# Patient Record
Sex: Female | Born: 1965 | Race: White | Hispanic: No | Marital: Married | Smoking: Never smoker
Health system: Southern US, Community
[De-identification: ages and names within clinical notes are randomized; demographics above are authoritative.]

## PROBLEM LIST (undated history)

## (undated) DIAGNOSIS — J302 Other seasonal allergic rhinitis: Secondary | ICD-10-CM

## (undated) DIAGNOSIS — F329 Major depressive disorder, single episode, unspecified: Secondary | ICD-10-CM

## (undated) DIAGNOSIS — F32A Depression, unspecified: Secondary | ICD-10-CM

## (undated) HISTORY — PX: OTHER SURGICAL HISTORY: SHX169

---

## 2006-03-05 ENCOUNTER — Other Ambulatory Visit: Admission: RE | Admit: 2006-03-05 | Discharge: 2006-03-05 | Payer: Self-pay | Admitting: Gynecology

## 2006-09-04 ENCOUNTER — Emergency Department (HOSPITAL_COMMUNITY): Admission: EM | Admit: 2006-09-04 | Discharge: 2006-09-04 | Payer: Self-pay | Admitting: Emergency Medicine

## 2006-09-11 ENCOUNTER — Encounter: Admission: RE | Admit: 2006-09-11 | Discharge: 2006-09-11 | Payer: Self-pay | Admitting: Family Medicine

## 2007-03-18 ENCOUNTER — Other Ambulatory Visit: Admission: RE | Admit: 2007-03-18 | Discharge: 2007-03-18 | Payer: Self-pay | Admitting: Gynecology

## 2007-03-23 ENCOUNTER — Ambulatory Visit: Payer: Self-pay | Admitting: Oncology

## 2008-03-28 ENCOUNTER — Ambulatory Visit: Payer: Self-pay | Admitting: Genetic Counselor

## 2010-02-02 ENCOUNTER — Encounter: Admission: RE | Admit: 2010-02-02 | Discharge: 2010-02-02 | Payer: Self-pay | Admitting: Gynecology

## 2011-02-26 ENCOUNTER — Other Ambulatory Visit: Payer: Self-pay | Admitting: Gynecology

## 2011-02-26 DIAGNOSIS — Z1231 Encounter for screening mammogram for malignant neoplasm of breast: Secondary | ICD-10-CM

## 2011-04-16 ENCOUNTER — Ambulatory Visit: Payer: Self-pay

## 2011-04-30 ENCOUNTER — Ambulatory Visit: Payer: Self-pay

## 2011-05-14 ENCOUNTER — Ambulatory Visit: Payer: Self-pay

## 2012-02-25 ENCOUNTER — Other Ambulatory Visit: Payer: Self-pay | Admitting: Gynecology

## 2012-02-25 DIAGNOSIS — R922 Inconclusive mammogram: Secondary | ICD-10-CM

## 2012-03-04 ENCOUNTER — Ambulatory Visit
Admission: RE | Admit: 2012-03-04 | Discharge: 2012-03-04 | Disposition: A | Discharge status: Home / Self Care | Payer: Private Health Insurance - Indemnity | Source: Ambulatory Visit | Attending: Gynecology | Admitting: Gynecology

## 2012-03-04 ENCOUNTER — Other Ambulatory Visit: Payer: Self-pay | Admitting: Gynecology

## 2012-03-04 DIAGNOSIS — R9230 Dense breasts, unspecified: Secondary | ICD-10-CM

## 2012-03-04 DIAGNOSIS — R922 Inconclusive mammogram: Secondary | ICD-10-CM

## 2014-05-03 ENCOUNTER — Other Ambulatory Visit: Payer: Self-pay | Admitting: Obstetrics and Gynecology

## 2014-05-09 ENCOUNTER — Other Ambulatory Visit: Payer: Self-pay | Admitting: Obstetrics and Gynecology

## 2014-05-09 DIAGNOSIS — N6311 Unspecified lump in the right breast, upper outer quadrant: Secondary | ICD-10-CM

## 2014-06-08 ENCOUNTER — Other Ambulatory Visit: Payer: Private Health Insurance - Indemnity

## 2014-06-20 ENCOUNTER — Ambulatory Visit
Admission: RE | Admit: 2014-06-20 | Discharge: 2014-06-20 | Disposition: A | Discharge status: Home / Self Care | Payer: Managed Care, Other (non HMO) | Source: Ambulatory Visit | Attending: Obstetrics and Gynecology | Admitting: Obstetrics and Gynecology

## 2014-06-20 DIAGNOSIS — N6311 Unspecified lump in the right breast, upper outer quadrant: Secondary | ICD-10-CM

## 2016-06-11 ENCOUNTER — Telehealth: Payer: Self-pay | Admitting: Urology

## 2016-06-11 DIAGNOSIS — Z79899 Other long term (current) drug therapy: Secondary | ICD-10-CM | POA: Diagnosis not present

## 2016-06-11 DIAGNOSIS — R1032 Left lower quadrant pain: Secondary | ICD-10-CM | POA: Diagnosis not present

## 2016-06-11 DIAGNOSIS — N132 Hydronephrosis with renal and ureteral calculous obstruction: Secondary | ICD-10-CM | POA: Diagnosis not present

## 2016-06-11 DIAGNOSIS — R1011 Right upper quadrant pain: Secondary | ICD-10-CM | POA: Diagnosis not present

## 2016-06-11 DIAGNOSIS — F17200 Nicotine dependence, unspecified, uncomplicated: Secondary | ICD-10-CM | POA: Diagnosis not present

## 2016-06-11 DIAGNOSIS — R109 Unspecified abdominal pain: Secondary | ICD-10-CM | POA: Diagnosis not present

## 2016-06-11 NOTE — Telephone Encounter (Signed)
Patient currently at [facility] in the ED with a 4.128mm left mid-ureteral stone.  ED physician having difficult time with the pain control.  Patient anxious about leaving ED due to pain.  Hospitalist at [facility] reluctant to admit patient since there is no Urologist there.  Request [facility].  I agreed to consult on this patient in the AM if she is transferred to [facility] for pain control.  IF in the AM she is more comfortable, we would likely arrange shockwave lithotripsy on Thursday as outpatient.  If her pain is uncontrolled we would potentially proceed to OR for ureteroscopy.  If patient is admitted, would recommend IV tylenol.  A second option would be IV lidocaine 1.5mg /kg infused over 60 minutes.  Would not recommend Toradol since this may prevent her from having her stone treated on Thursday.

## 2016-06-12 ENCOUNTER — Encounter (HOSPITAL_COMMUNITY): Payer: Self-pay

## 2016-06-12 ENCOUNTER — Observation Stay (HOSPITAL_COMMUNITY)
Admission: AD | Admit: 2016-06-12 | Discharge: 2016-06-13 | Disposition: A | Discharge status: Home / Self Care | Payer: BLUE CROSS/BLUE SHIELD | Source: Other Acute Inpatient Hospital | Attending: Internal Medicine | Admitting: Internal Medicine

## 2016-06-12 DIAGNOSIS — R3129 Other microscopic hematuria: Secondary | ICD-10-CM

## 2016-06-12 DIAGNOSIS — Z79899 Other long term (current) drug therapy: Secondary | ICD-10-CM | POA: Diagnosis not present

## 2016-06-12 DIAGNOSIS — N200 Calculus of kidney: Secondary | ICD-10-CM | POA: Diagnosis not present

## 2016-06-12 DIAGNOSIS — J302 Other seasonal allergic rhinitis: Secondary | ICD-10-CM | POA: Insufficient documentation

## 2016-06-12 DIAGNOSIS — F418 Other specified anxiety disorders: Secondary | ICD-10-CM | POA: Diagnosis not present

## 2016-06-12 DIAGNOSIS — Z79890 Hormone replacement therapy (postmenopausal): Secondary | ICD-10-CM | POA: Insufficient documentation

## 2016-06-12 DIAGNOSIS — R112 Nausea with vomiting, unspecified: Secondary | ICD-10-CM | POA: Diagnosis not present

## 2016-06-12 DIAGNOSIS — N136 Pyonephrosis: Principal | ICD-10-CM | POA: Insufficient documentation

## 2016-06-12 DIAGNOSIS — N390 Urinary tract infection, site not specified: Secondary | ICD-10-CM

## 2016-06-12 DIAGNOSIS — N1330 Unspecified hydronephrosis: Secondary | ICD-10-CM

## 2016-06-12 DIAGNOSIS — R319 Hematuria, unspecified: Secondary | ICD-10-CM

## 2016-06-12 DIAGNOSIS — N201 Calculus of ureter: Secondary | ICD-10-CM | POA: Diagnosis not present

## 2016-06-12 DIAGNOSIS — R1032 Left lower quadrant pain: Secondary | ICD-10-CM | POA: Diagnosis not present

## 2016-06-12 HISTORY — DX: Major depressive disorder, single episode, unspecified: F32.9

## 2016-06-12 HISTORY — DX: Depression, unspecified: F32.A

## 2016-06-12 HISTORY — DX: Other seasonal allergic rhinitis: J30.2

## 2016-06-12 MED ORDER — SODIUM CHLORIDE 0.9 % IV SOLN
INTRAVENOUS | Status: DC
  Administered 2016-06-12: 19:00:00 via INTRAVENOUS

## 2016-06-12 MED ORDER — LORATADINE 10 MG PO TABS
10.0000 mg | ORAL_TABLET | Freq: Every day | ORAL | Status: DC
  Administered 2016-06-12 – 2016-06-13 (×2): 10 mg via ORAL
  Filled 2016-06-12 (×2): qty 1

## 2016-06-12 MED ORDER — TAMSULOSIN HCL 0.4 MG PO CAPS: 0.4000 mg | ORAL_CAPSULE | Freq: Every day | ORAL | Status: DC

## 2016-06-12 MED ORDER — PROGESTERONE MICRONIZED 100 MG PO CAPS
100.0000 mg | ORAL_CAPSULE | Freq: Every day | ORAL | Status: DC
  Administered 2016-06-12: 100 mg via ORAL
  Filled 2016-06-12: qty 1

## 2016-06-12 MED ORDER — ONDANSETRON HCL 4 MG PO TABS: 4.0000 mg | ORAL_TABLET | Freq: Four times a day (QID) | ORAL | Status: DC | PRN

## 2016-06-12 MED ORDER — OXYCODONE HCL 5 MG PO TABS: 5.0000 mg | ORAL_TABLET | ORAL | Status: DC | PRN

## 2016-06-12 MED ORDER — ACETAMINOPHEN 325 MG PO TABS: 650.0000 mg | ORAL_TABLET | Freq: Four times a day (QID) | ORAL | Status: DC | PRN

## 2016-06-12 MED ORDER — BUPROPION HCL ER (XL) 150 MG PO TB24
150.0000 mg | ORAL_TABLET | Freq: Every day | ORAL | Status: DC
  Administered 2016-06-12 – 2016-06-13 (×2): 150 mg via ORAL
  Filled 2016-06-12 (×2): qty 1

## 2016-06-12 MED ORDER — ACETAMINOPHEN 650 MG RE SUPP: 650.0000 mg | Freq: Four times a day (QID) | RECTAL | Status: DC | PRN

## 2016-06-12 MED ORDER — TAMSULOSIN HCL 0.4 MG PO CAPS
0.4000 mg | ORAL_CAPSULE | Freq: Every day | ORAL | Status: DC
  Administered 2016-06-13: 0.4 mg via ORAL
  Filled 2016-06-12: qty 1

## 2016-06-12 MED ORDER — MORPHINE SULFATE (PF) 4 MG/ML IV SOLN
2.0000 mg | INTRAVENOUS | Status: DC | PRN
  Administered 2016-06-12: 2 mg via INTRAVENOUS
  Filled 2016-06-12: qty 1

## 2016-06-12 MED ORDER — KETOROLAC TROMETHAMINE 30 MG/ML IJ SOLN
30.0000 mg | Freq: Four times a day (QID) | INTRAMUSCULAR | Status: DC | PRN
  Administered 2016-06-12 – 2016-06-13 (×4): 30 mg via INTRAVENOUS
  Filled 2016-06-12 (×4): qty 1

## 2016-06-12 MED ORDER — ONDANSETRON HCL 4 MG/2ML IJ SOLN
4.0000 mg | Freq: Four times a day (QID) | INTRAMUSCULAR | Status: DC | PRN
  Administered 2016-06-12: 4 mg via INTRAVENOUS
  Filled 2016-06-12: qty 2

## 2016-06-12 MED ORDER — ESTRADIOL 0.05 MG/24HR TD PTWK: 0.0500 mg | MEDICATED_PATCH | Freq: Once | TRANSDERMAL | Status: DC

## 2016-06-12 NOTE — Progress Notes (Signed)
This is a no charge note  Transfer from [facility] per Dr. Franchot Erichsenallara.  51 year old lady with past medical history of depression, who presents with left flank pain, found to have a 57 cm left kidney stone. No fever or chills. Urinalysis negative for UTI, but positive for blood. Urology, Dr. Marlou PorchHerrick was consulted, who agreed to consult on this patient in the AM. Per Dr. Marlou PorchHerrick, if in the AM she is more comfortable, would likely arrange shockwave lithotripsy on Thursday as outpatient, but if her pain is uncontrolled, would potentially proceed to OR for ureteroscopy. Per Dr. Marlou PorchHerrick, "If patient is admitted, would recommend IV tylenol.  A second option would be IV lidocaine 1.5mg /kg infused over 60 minutes.  Would not recommend Toradol since this may prevent her from having her stone treated on Thursday". Pt is accepted to med-surg bed as inpt.  Lorretta HarpXilin Adisa Litt, MD  [facility] Pager [phone]  If 7PM-7AM, please contact night-coverage [email or web address] Password [email or web address] 06/12/2016, 4:45 AM

## 2016-06-12 NOTE — Consult Note (Signed)
Urology Consult   Physician requesting consult: Rizwan Reason for consult: Kidney stone  History of Present Illness: [name] is a 51 y.o. female who presented to the emergency room at [facility] in [place] last night following sudden onset of left flank pain, nausea and vomiting at 7 p.m.  She was found to have a 5 by 7 millimeter left ureteral stone.  Apparently, she could not be made comfortable there, and the hospitalist service would not admit her for comfort measures.  There is no urologist in [place] at this time.  Urology here, was contacted and instructed the emergency room staff to transport the patient to [facility] for further evaluation and management of the stone.  The patient, until just prior to her transport, was fairly comfortable.  She did get a small dose of fentanyl prior to the transportation.  Currently, she is fairly comfortable.  She has had no fever, gross hematuria, or long-standing left flank pain.  This is her first kidney stone.    Past Medical History:  Diagnosis Date  . Depression   . Seasonal allergies     Past Surgical History:  Procedure Laterality Date  . CESAREAN SECTION    . Left hand surgery       Current Hospital Medications: Scheduled Meds: . buPROPion  150 mg Oral Daily  . [START ON 06/14/2016] estradiol  0.05 mg Transdermal Once  . loratadine  10 mg Oral Daily  . progesterone  100 mg Oral QHS  . tamsulosin  0.4 mg Oral Daily   Continuous Infusions: . sodium chloride     PRN Meds:.acetaminophen **OR** acetaminophen, morphine injection, ondansetron **OR** ondansetron (ZOFRAN) IV, oxyCODONE  Allergies:  Allergies  Allergen Reactions  . No Known Allergies     History reviewed. No pertinent family history.  Social History:  reports that she has never smoked. She has never used smokeless tobacco. She reports that she does not drink alcohol or use drugs.  ROS: A complete review of systems was  performed.  All systems are negative except for pertinent findings as noted.  Physical Exam:  Vital signs in last 24 hours: Temp:  [98.4 F (36.9 C)] 98.4 F (36.9 C) (03/07 1607) Pulse Rate:  [68] 68 (03/07 1607) Resp:  [20] 20 (03/07 1607) BP: (142)/(62) 142/62 (03/07 1607) SpO2:  [99 %] 99 % (03/07 1607) Weight:  [65.8 kg (145 lb)] 65.8 kg (145 lb) (03/07 1607) General:  Alert and oriented, No acute distress HEENT: Normocephalic, atraumatic Neck: No JVD or lymphadenopathy Cardiovascular: Regular rate and rhythm Lungs: Clear bilaterally Abdomen: Soft, nontender, nondistended, no abdominal masses Extremities: No edema Neurologic: Grossly intact  Laboratory Data:  No results for input(s): WBC, HGB, HCT, PLT in the last 72 hours.  No results for input(s): NA, K, CL, GLUCOSE, BUN, CALCIUM, CREATININE in the last 72 hours.  Invalid input(s): CO3   No results found for this or any previous visit (from the past 24 hour(s)). No results found for this or any previous visit (from the past 240 hour(s)).  Renal Function: No results for input(s): CREATININE in the last 168 hours. CrCl cannot be calculated (No order found.).  Radiologic Imaging: I reviewed CT images with the patient.  She does have a 5 by 7 millimeter left upper ureteral stone just at the level of the lower renal margin.  No other renal calculi are noted.  I independently reviewed the above imaging studies.  Impression/Assessment:  4.3 by 8 millimeter left ureteral  stone.  This is well seen on the scout film.  Skin the stone distance approximately 12 centimeters, Hounsfield units 658.  Plan:  I agree with admission for pain management.  I discussed further management with the patient-medical expulsive therapy with pain management as an outpatient, shockwave lithotripsy or anesthetic ureteroscopy with holmium laser lithotripsy and extraction of her stone.  The patient has not been strictly nothing by mouth, so at  this point, I don't think urgent anesthetic procedure is necessary.  Unfortunately, she has had Toradol in the emergency room in [place]-even though it was only 10 milligrams intravenous, this would preclude shockwave lithotripsy due to platelet dysfunction.  I have spoken with the patient.  Currently, she is fairly comfortable.  She is in agreement that if she is comfortable overnight, morning discharge would be a possibility with going home on by mouth hydromorphone, nausea medicine, as well as Flomax.  We will see about getting her scheduled for lithotripsy for Monday the 12th.  45 minutes spent face-to-face with the patient.

## 2016-06-12 NOTE — H&P (Addendum)
History and Physical    Abigail Mills ZOX:[id number] DOB: [birth year] DOA: 06/12/2016    PCP: No primary care provider on file.  Patient coming from: home  Chief Complaint: left lower abdominal pain  HPI: Abigail Mills is a 51 y.o. female with medical history significant of depression who presents for sudden onset left lower abdominal pain on 3/6 at 7 PM associated with large amount of vomiting. [facility] and was found to have hematuria. CT showed a left nephrolithiasis and hydroureter. She was accepted [facility] be [facility]. She states pain improved after medication last night but recurred today. She was given Fentanyl today. Pain is currently a 4-5/10. She received Fentanyl per RN report before being transferred. Mild nausea. No radiation of pain. No flank pain. Movement ie, walking aggravates it. It then is more of a constant pain rather than spasmodic. No fever or chills. No h/o nephrolithiasis.    ED Course: as above  Review of Systems:  All other systems reviewed and apart from HPI, are negative.  Past Medical History:  Diagnosis Date  . Depression   . Seasonal allergies     Past Surgical History:  Procedure Laterality Date  . CESAREAN SECTION    . Left hand surgery      Social History:   reports that she has never smoked. She has never used smokeless tobacco. She reports that she does not  use drugs. She drinks alcohol occasionally.   Allergies  Allergen Reactions  . No Known Allergies     History reviewed. No pertinent family history.   Prior to Admission medications   Medication Sig Start Date End Date Taking? Authorizing Provider  buPROPion (WELLBUTRIN XL) 150 MG 24 hr tablet Take 150 mg by mouth daily. 03/25/16  Yes Historical Provider, MD  estradiol (VIVELLE-DOT) 0.05 MG/24HR patch APP 1 PA EXT TO THE SKIN 2 TIMES A WK 05/22/16  Yes Historical Provider, MD  fexofenadine (ALLEGRA) 30 MG tablet Take 30 mg by  mouth daily.    Yes Historical Provider, MD  Probiotic Product (PROBIOTIC PO) Take 1 capsule by mouth daily.   Yes Historical Provider, MD  progesterone (PROMETRIUM) 100 MG capsule TAKE 1 C BY MOUTH AT BEDTIME 05/26/16  Yes Historical Provider, MD    Physical Exam: Vitals:   06/12/16 1607  BP: (!) 142/62  Pulse: 68  Resp: 20  Temp: 98.4 F (36.9 C)  TempSrc: Oral  SpO2: 99%  Weight: 65.8 kg (145 lb)  Height: 5\' 4"  (1.626 m)      Constitutional: NAD, calm, comfortable Eyes: PERTLA, lids and conjunctivae normal ENMT: Mucous membranes are moist. Posterior pharynx clear of any exudate or lesions. Normal dentition.  Neck: normal, supple, no masses, no thyromegaly Respiratory: clear to auscultation bilaterally, no wheezing, no crackles. Normal respiratory effort. No accessory muscle use.  Cardiovascular: S1 & S2 heard, regular rate and rhythm, no murmurs / rubs / gallops. No extremity edema. 2+ pedal pulses. No carotid bruits.  Abdomen: No distension, tenderness in LLQ and suprapubic area, no masses palpated. No hepatosplenomegaly. Bowel sounds normal.  Musculoskeletal: no clubbing / cyanosis. No joint deformity upper and lower extremities. Good ROM, no contractures. Normal muscle tone.  Skin: no rashes, lesions, ulcers. No induration Neurologic: CN 2-12 grossly intact. Sensation intact, DTR normal. Strength 5/5 in all 4 limbs.  Psychiatric: Normal judgment and insight. Alert and oriented x 3. Normal mood.     Labs on Admission: I have personally  reviewed following labs and imaging studies  CBC: No results for input(s): WBC, NEUTROABS, HGB, HCT, MCV, PLT in the last 168 hours. Basic Metabolic Panel: No results for input(s): NA, K, CL, CO2, GLUCOSE, BUN, CREATININE, CALCIUM, MG, PHOS in the last 168 hours. GFR: CrCl cannot be calculated (No order found.). Liver Function Tests: No results for input(s): AST, ALT, ALKPHOS, BILITOT, PROT, ALBUMIN in the last 168 hours. No results for  input(s): LIPASE, AMYLASE in the last 168 hours. No results for input(s): AMMONIA in the last 168 hours. Coagulation Profile: No results for input(s): INR, PROTIME in the last 168 hours. Cardiac Enzymes: No results for input(s): CKTOTAL, CKMB, CKMBINDEX, TROPONINI in the last 168 hours. BNP (last 3 results) No results for input(s): PROBNP in the last 8760 hours. HbA1C: No results for input(s): HGBA1C in the last 72 hours. CBG: No results for input(s): GLUCAP in the last 168 hours. Lipid Profile: No results for input(s): CHOL, HDL, LDLCALC, TRIG, CHOLHDL, LDLDIRECT in the last 72 hours. Thyroid Function Tests: No results for input(s): TSH, T4TOTAL, FREET4, T3FREE, THYROIDAB in the last 72 hours. Anemia Panel: No results for input(s): VITAMINB12, FOLATE, FERRITIN, TIBC, IRON, RETICCTPCT in the last 72 hours. Urine analysis: No results found for: COLORURINE, APPEARANCEUR, LABSPEC, PHURINE, GLUCOSEU, HGBUR, BILIRUBINUR, KETONESUR, PROTEINUR, UROBILINOGEN, NITRITE, LEUKOCYTESUR Sepsis Labs: @LABRCNTIP (procalcitonin:4,lacticidven:4) )No results found for this or any previous visit (from the past 240 hour(s)).   Radiological Exams on Admission: No results found.     Assessment/Plan Principal Problem:   Left nephrolithiasis/ hydronephrosis/ hematuria/ abdominal pain - Flomax given last night - control pain with IV and oral pain medications - no fever or UTI - Zofran PRN for nausea- Ice chips for now - d/w Dr Retta Diones who will see the patient soon  Active Problems:     Depression with anxiety - resume Wellbutrin  Allergies - resume Allegra   Resume hormone replacement meds- estradiol and Progestrone      DVT prophylaxis: if no procedure today or tomorrow, will order Lovenox  Code Status: Full code  Family Communication:   Disposition Plan: med surg  Consults called: Urology  Admission status: inpatient     [facility] MD [facility] Pager: [email or web address] Password [email or web address] 7PM-7AM, please contact night-coverage   06/12/2016, 5:06 PM

## 2016-06-13 ENCOUNTER — Inpatient Hospital Stay: Payer: Managed Care, Other (non HMO)

## 2016-06-13 ENCOUNTER — Other Ambulatory Visit: Payer: Self-pay | Admitting: Urology

## 2016-06-13 DIAGNOSIS — J302 Other seasonal allergic rhinitis: Secondary | ICD-10-CM | POA: Diagnosis not present

## 2016-06-13 DIAGNOSIS — Z79899 Other long term (current) drug therapy: Secondary | ICD-10-CM | POA: Diagnosis not present

## 2016-06-13 DIAGNOSIS — N200 Calculus of kidney: Secondary | ICD-10-CM | POA: Diagnosis not present

## 2016-06-13 DIAGNOSIS — F418 Other specified anxiety disorders: Secondary | ICD-10-CM | POA: Diagnosis not present

## 2016-06-13 DIAGNOSIS — Z79890 Hormone replacement therapy (postmenopausal): Secondary | ICD-10-CM | POA: Diagnosis not present

## 2016-06-13 DIAGNOSIS — N136 Pyonephrosis: Secondary | ICD-10-CM | POA: Diagnosis not present

## 2016-06-13 LAB — BASIC METABOLIC PANEL
ANION GAP: 5 (ref 5–15)
BUN: 16 mg/dL (ref 6–20)
CALCIUM: 8.1 mg/dL — AB (ref 8.9–10.3)
CO2: 27 mmol/L (ref 22–32)
Chloride: 108 mmol/L (ref 101–111)
Creatinine, Ser: 0.88 mg/dL (ref 0.44–1.00)
GFR calc non Af Amer: >60 mL/min (ref >60)
Glucose, Bld: 95 mg/dL (ref 65–99)
Potassium: 3.7 mmol/L (ref 3.5–5.1)
Sodium: 140 mmol/L (ref 135–145)

## 2016-06-13 LAB — CBC
HCT: 36.4 % (ref 36.0–46.0)
HEMOGLOBIN: 12.1 g/dL (ref 12.0–15.0)
MCH: 30.1 pg (ref 26.0–34.0)
MCHC: 33.2 g/dL (ref 30.0–36.0)
MCV: 90.5 fL (ref 78.0–100.0)
Platelets: 271 10*3/uL (ref 150–400)
RBC: 4.02 MIL/uL (ref 3.87–5.11)
RDW: 12.4 % (ref 11.5–15.5)
WBC: 7.6 10*3/uL (ref 4.0–10.5)

## 2016-06-13 LAB — HIV ANTIBODY (ROUTINE TESTING W REFLEX): HIV Screen 4th Generation wRfx: NONREACTIVE

## 2016-06-13 MED ORDER — HYDROMORPHONE HCL 2 MG PO TABS: 2.0000 mg | ORAL_TABLET | ORAL | 0 refills | Status: DC | PRN

## 2016-06-13 MED ORDER — TAMSULOSIN HCL 0.4 MG PO CAPS: 0.4000 mg | ORAL_CAPSULE | Freq: Every day | ORAL | 0 refills | Status: AC

## 2016-06-13 MED ORDER — TAMSULOSIN HCL 0.4 MG PO CAPS: 0.4000 mg | ORAL_CAPSULE | Freq: Every day | ORAL | 0 refills | Status: DC

## 2016-06-13 NOTE — Discharge Summary (Signed)
Discharge Summary  Abigail Mills [id number] DOB: 1965-04-10  PCP: No primary care provider on file.  Admit date: 06/12/2016 Discharge date: 06/13/2016  Time spent: <8730mins  Recommendations for Outpatient Follow-up:  1. F/u with PMD within a week  for hospital discharge follow up, repeat cbc/bmp at follow up 2. F/u with urology on 3/12 for lithotripsy  Discharge Diagnoses:  Active Hospital Problems   Diagnosis Date Noted  . Left nephrolithiasis 06/12/2016  . Hydronephrosis of left kidney 06/12/2016  . Depression with anxiety 06/12/2016  . Hematuria 06/12/2016  . Nephrolithiasis 06/12/2016    Resolved Hospital Problems   Diagnosis Date Noted Date Resolved  . Urinary tract infection 06/12/2016 06/12/2016    Discharge Condition: stable  Diet recommendation: regular diet  Filed Weights   06/12/16 1607  Weight: 65.8 kg (145 lb)    History of present illness:  Patient coming from: home  Chief Complaint: left lower abdominal pain  HPI: Abigail Mills is a 51 y.o. female with medical history significant of depression who presents for sudden onset left lower abdominal pain on 3/6 at 7 PM associated with large amount of vomiting. [facility] and was found to have hematuria. CT showed a left nephrolithiasis and hydroureter. She was [facility]. She states pain improved after medication last night but recurred today. She was given Fentanyl today. Pain is currently a 4-5/10. She received Fentanyl per RN report before being transferred. Mild nausea. No radiation of pain. No flank pain. Movement ie, walking aggravates it. It then is more of a constant pain rather than spasmodic. No fever or chills. No h/o nephrolithiasis.    ED Course: as above  Hospital Course:  Principal Problem:   Left nephrolithiasis Active Problems:   Hydronephrosis of left kidney   Depression with anxiety   Hematuria  Nephrolithiasis   Left nephrolithiasis/ hydronephrosis/ hematuria/ abdominal pain - Flomax given last night - control pain with IV and oral pain medications - no fever or UTI - Zofran PRN for nausea-  - urology Dr Retta Dionesahlstedt input appreciated, she is cleared to discharge home with dilaudid prn and flomax and outpatient lithotripsy on monday  Active Problems:     Depression with anxiety - resume Wellbutrin  Allergies - resume Allegra   Resume hormone replacement meds- estradiol and Progestrone      Code Status: Full code  Family Communication:  patient Disposition Plan: med surg  Consults called: Urology    Discharge Exam: BP 102/64 (BP Location: Left Arm)   Pulse 77   Temp 97.8 F (36.6 C) (Oral)   Resp 20   Ht 5\' 4"  (1.626 m)   Wt 65.8 kg (145 lb)   SpO2 95%   BMI 24.89 kg/m   General: NAD Cardiovascular: RRR Respiratory: CTABL Abdominal, mild left flank tenderness  Discharge Instructions You were cared for by a hospitalist during your hospital stay. If you have any questions about your discharge medications or the care you received while you were in the hospital after you are discharged, you can call the unit and asked to speak with the hospitalist on call if the hospitalist that took care of you is not available. Once you are discharged, your primary care physician will handle any further medical issues. Please note that NO REFILLS for any discharge medications will be authorized once you are discharged, as it is imperative that you return to your primary care physician (or establish a relationship with a  primary care physician if you do not have one) for your aftercare needs so that they can reassess your need for medications and monitor your lab values.  Discharge Instructions    Diet general    Complete by:  As directed    Increase activity slowly    Complete by:  As directed      Allergies as of 06/13/2016      Reactions   No Known Allergies         Medication List    TAKE these medications   buPROPion 150 MG 24 hr tablet Commonly known as:  WELLBUTRIN XL Take 150 mg by mouth daily.   estradiol 0.05 MG/24HR patch Commonly known as:  VIVELLE-DOT APP 1 PA EXT TO THE SKIN 2 TIMES A WK   fexofenadine 30 MG tablet Commonly known as:  ALLEGRA Take 30 mg by mouth daily.   HYDROmorphone 2 MG tablet Commonly known as:  DILAUDID Take 1 tablet (2 mg total) by mouth every 4 (four) hours as needed for severe pain.   PROBIOTIC PO Take 1 capsule by mouth daily.   progesterone 100 MG capsule Commonly known as:  PROMETRIUM TAKE 1 C BY MOUTH AT BEDTIME   tamsulosin 0.4 MG Caps capsule Commonly known as:  FLOMAX Take 1 capsule (0.4 mg total) by mouth daily after supper.      Allergies  Allergen Reactions  . No Known Allergies    Follow-up Information    [facility] Follow up.   Contact information: [address]  [place] [address] [place] [address] [phone]            The results of significant diagnostics from this hospitalization (including imaging, microbiology, ancillary and laboratory) are listed below for reference.    Significant Diagnostic Studies: No results found.  Microbiology: No results found for this or any previous visit (from the past 240 hour(s)).   Labs: Basic Metabolic Panel:  Recent Labs Lab 06/13/16 0549  NA 140  K 3.7  CL 108  CO2 27  GLUCOSE 95  BUN 16  CREATININE 0.88  CALCIUM 8.1*   Liver Function Tests: No results for input(s): AST, ALT, ALKPHOS, BILITOT, PROT, ALBUMIN in the last 168 hours. No results for input(s): LIPASE, AMYLASE in the last 168 hours. No results for input(s): AMMONIA in the last 168 hours. CBC:  Recent Labs Lab 06/13/16 0549  WBC 7.6  HGB 12.1  HCT 36.4  MCV 90.5  PLT 271   Cardiac Enzymes: No results for input(s): CKTOTAL, CKMB, CKMBINDEX, TROPONINI in the last 168 hours. BNP: BNP (last 3 results) No results for input(s): BNP  in the last 8760 hours.  ProBNP (last 3 results) No results for input(s): PROBNP in the last 8760 hours.  CBG: No results for input(s): GLUCAP in the last 168 hours.     SignedAlbertine Grates MD, PhD  [facility] 06/13/2016, 11:35 AM

## 2016-06-13 NOTE — Progress Notes (Signed)
  Subjective: Patient reports  that she had a fairly restful night.  She did take Toradol a few minutes ago, but did not have significant pain  Objective: Vital signs in last 24 hours: Temp:  [97.8 F (36.6 C)-99.1 F (37.3 C)] 97.8 F (36.6 C) (03/08 0607) Pulse Rate:  [68-86] 77 (03/08 0607) Resp:  [20] 20 (03/08 0607) BP: (102-142)/(62-68) 102/64 (03/08 0607) SpO2:  [95 %-99 %] 95 % (03/08 0607) Weight:  [65.8 kg (145 lb)] 65.8 kg (145 lb) (03/07 1607)  Intake/Output from previous day: 03/07 0701 - 03/08 0700 In: 560 [I.V.:560] Out: 300 [Urine:300] Intake/Output this shift: No intake/output data recorded.  Physical Exam:  Constitutional: Vital signs reviewed. WD WN in NAD   Eyes: PERRL, No scleral icterus.   Cardiovascular: RRR Pulmonary/Chest: Normal effort  Lab Results:  Recent Labs  06/13/16 0549  HGB 12.1  HCT 36.4   BMET  Recent Labs  06/13/16 0549  NA 140  K 3.7  CL 108  CO2 27  GLUCOSE 95  BUN 16  CREATININE 0.88  CALCIUM 8.1*   No results for input(s): LABPT, INR in the last 72 hours. No results for input(s): LABURIN in the last 72 hours. No results found for this or any previous visit.  Studies/Results: No results found.  Assessment/Plan:   Left ureteral stone.  Fairly comfortable.  The present time.    At this point, I think she can go home.  I have discussed with her for quite a long period of time (20 minutes during our visit today).  The options of ureteroscopy and laser lithotripsy versus shockwave lithotripsy.  I think she is a good candidate for the latter of these.    I feel that she is fine to go.  I have provided a prescription for Dilaudid and Flomax.  We will call her to set up an appointment for her lithotripsy.   LOS: 1 day   Chelsea AusDAHLSTEDT, Kayloni Rocco M 06/13/2016, 7:54 AM

## 2016-06-13 NOTE — Discharge Instructions (Signed)
1.  We will call you to set up lithotripsy appointment.  2.  Call our office if you experience significant pain that is intolerable before your procedure.  3.  Report fever, significant nausea or vomiting.  4.  Do not take any anti-inflammatory agents between the time of discharge and your lithotripsy

## 2016-06-14 ENCOUNTER — Emergency Department (HOSPITAL_COMMUNITY)
Admission: EM | Admit: 2016-06-14 | Discharge: 2016-06-14 | Disposition: A | Discharge status: Home / Self Care | Payer: BLUE CROSS/BLUE SHIELD | Attending: Emergency Medicine | Admitting: Emergency Medicine

## 2016-06-14 ENCOUNTER — Encounter (HOSPITAL_COMMUNITY): Payer: Self-pay | Admitting: Emergency Medicine

## 2016-06-14 ENCOUNTER — Encounter (HOSPITAL_COMMUNITY): Payer: Self-pay | Admitting: *Deleted

## 2016-06-14 DIAGNOSIS — R109 Unspecified abdominal pain: Secondary | ICD-10-CM

## 2016-06-14 DIAGNOSIS — R1032 Left lower quadrant pain: Secondary | ICD-10-CM | POA: Diagnosis not present

## 2016-06-14 DIAGNOSIS — Z79899 Other long term (current) drug therapy: Secondary | ICD-10-CM | POA: Diagnosis not present

## 2016-06-14 DIAGNOSIS — R1030 Lower abdominal pain, unspecified: Secondary | ICD-10-CM | POA: Diagnosis not present

## 2016-06-14 LAB — COMPREHENSIVE METABOLIC PANEL
ALT: 14 U/L (ref 14–54)
AST: 21 U/L (ref 15–41)
Albumin: 3.9 g/dL (ref 3.5–5.0)
Alkaline Phosphatase: 46 U/L (ref 38–126)
Anion gap: 7 (ref 5–15)
BUN: 14 mg/dL (ref 6–20)
CO2: 27 mmol/L (ref 22–32)
Calcium: 8.5 mg/dL — ABNORMAL LOW (ref 8.9–10.3)
Chloride: 102 mmol/L (ref 101–111)
Creatinine, Ser: 1.28 mg/dL — ABNORMAL HIGH (ref 0.44–1.00)
GFR calc Af Amer: 55 mL/min — ABNORMAL LOW (ref >60)
GFR calc non Af Amer: 47 mL/min — ABNORMAL LOW (ref >60)
Glucose, Bld: 128 mg/dL — ABNORMAL HIGH (ref 65–99)
Potassium: 3.3 mmol/L — ABNORMAL LOW (ref 3.5–5.1)
Sodium: 136 mmol/L (ref 135–145)
Total Bilirubin: 1.1 mg/dL (ref 0.3–1.2)
Total Protein: 7.1 g/dL (ref 6.5–8.1)

## 2016-06-14 LAB — CBC
HCT: 39 % (ref 36.0–46.0)
Hemoglobin: 13.3 g/dL (ref 12.0–15.0)
MCH: 30.4 pg (ref 26.0–34.0)
MCHC: 34.1 g/dL (ref 30.0–36.0)
MCV: 89 fL (ref 78.0–100.0)
Platelets: 251 10*3/uL (ref 150–400)
RBC: 4.38 MIL/uL (ref 3.87–5.11)
RDW: 12 % (ref 11.5–15.5)
WBC: 12 10*3/uL — ABNORMAL HIGH (ref 4.0–10.5)

## 2016-06-14 LAB — URINALYSIS, ROUTINE W REFLEX MICROSCOPIC
Bilirubin Urine: NEGATIVE
Glucose, UA: NEGATIVE mg/dL
Ketones, ur: 80 mg/dL — AB
Nitrite: NEGATIVE
Protein, ur: NEGATIVE mg/dL
Specific Gravity, Urine: 1.019 (ref 1.005–1.030)
pH: 5 (ref 5.0–8.0)

## 2016-06-14 LAB — LIPASE, BLOOD: Lipase: 24 U/L (ref 11–51)

## 2016-06-14 MED ORDER — ONDANSETRON HCL 4 MG/2ML IJ SOLN
4.0000 mg | Freq: Once | INTRAMUSCULAR | Status: DC | PRN
  Filled 2016-06-14: qty 2

## 2016-06-14 MED ORDER — HYDROMORPHONE HCL 1 MG/ML IJ SOLN
1.0000 mg | Freq: Once | INTRAMUSCULAR | Status: AC
  Administered 2016-06-14: 1 mg via INTRAVENOUS
  Filled 2016-06-14: qty 1

## 2016-06-14 MED ORDER — PROMETHAZINE HCL 25 MG/ML IJ SOLN
12.5000 mg | Freq: Once | INTRAMUSCULAR | Status: AC
  Administered 2016-06-14: 12.5 mg via INTRAVENOUS
  Filled 2016-06-14: qty 1

## 2016-06-14 MED ORDER — PROMETHAZINE HCL 25 MG PO TABS: 25.0000 mg | ORAL_TABLET | Freq: Three times a day (TID) | ORAL | 0 refills | Status: AC | PRN

## 2016-06-14 MED ORDER — SODIUM CHLORIDE 0.9 % IV BOLUS (SEPSIS)
1000.0000 mL | Freq: Once | INTRAVENOUS | Status: AC
  Administered 2016-06-14: 1000 mL via INTRAVENOUS

## 2016-06-14 NOTE — ED Notes (Signed)
Patient ambulated to restroom, was not able to provide enough for urine sample.

## 2016-06-14 NOTE — ED Triage Notes (Signed)
Patient states that she was recently discharged with kidney stone. And having lower abd pain with n/v patient took Dulcolax last night and today and just had "massive one" in lobby bathroom.  Patient denies any urinary problems.

## 2016-06-17 ENCOUNTER — Ambulatory Visit (HOSPITAL_COMMUNITY)
Admission: RE | Admit: 2016-06-17 | Discharge: 2016-06-17 | Disposition: A | Discharge status: Home / Self Care | Payer: BLUE CROSS/BLUE SHIELD | Source: Ambulatory Visit | Attending: Urology | Admitting: Urology

## 2016-06-17 ENCOUNTER — Ambulatory Visit (HOSPITAL_COMMUNITY): Payer: BLUE CROSS/BLUE SHIELD

## 2016-06-17 ENCOUNTER — Encounter (HOSPITAL_COMMUNITY)
Admission: RE | Disposition: A | Discharge status: Home / Self Care | Payer: Self-pay | Source: Ambulatory Visit | Attending: Urology

## 2016-06-17 ENCOUNTER — Encounter (HOSPITAL_COMMUNITY): Payer: Self-pay | Admitting: *Deleted

## 2016-06-17 DIAGNOSIS — Z79899 Other long term (current) drug therapy: Secondary | ICD-10-CM | POA: Diagnosis not present

## 2016-06-17 DIAGNOSIS — N200 Calculus of kidney: Secondary | ICD-10-CM | POA: Diagnosis not present

## 2016-06-17 DIAGNOSIS — F329 Major depressive disorder, single episode, unspecified: Secondary | ICD-10-CM | POA: Insufficient documentation

## 2016-06-17 DIAGNOSIS — Z539 Procedure and treatment not carried out, unspecified reason: Secondary | ICD-10-CM | POA: Insufficient documentation

## 2016-06-17 DIAGNOSIS — N201 Calculus of ureter: Secondary | ICD-10-CM | POA: Diagnosis not present

## 2016-06-17 HISTORY — PX: EXTRACORPOREAL SHOCK WAVE LITHOTRIPSY: SHX1557

## 2016-06-17 SURGERY — LITHOTRIPSY, ESWL
Anesthesia: LOCAL | Laterality: Left

## 2016-06-17 MED ORDER — ACETAMINOPHEN 325 MG PO TABS: 650.0000 mg | ORAL_TABLET | ORAL | Status: DC | PRN

## 2016-06-17 MED ORDER — SODIUM CHLORIDE 0.9 % IV SOLN
INTRAVENOUS | Status: DC
  Administered 2016-06-17: 11:00:00 via INTRAVENOUS

## 2016-06-17 MED ORDER — DIAZEPAM 5 MG PO TABS
10.0000 mg | ORAL_TABLET | ORAL | Status: AC
Start: 2016-06-17 — End: 2016-06-17
  Administered 2016-06-17: 10 mg via ORAL
  Filled 2016-06-17: qty 2

## 2016-06-17 MED ORDER — FENTANYL CITRATE (PF) 100 MCG/2ML IJ SOLN: 25.0000 ug | INTRAMUSCULAR | Status: DC | PRN

## 2016-06-17 MED ORDER — OXYCODONE HCL 5 MG PO TABS: 5.0000 mg | ORAL_TABLET | ORAL | Status: DC | PRN

## 2016-06-17 MED ORDER — HYDROMORPHONE HCL 2 MG PO TABS: 2.0000 mg | ORAL_TABLET | ORAL | 0 refills | Status: AC | PRN

## 2016-06-17 MED ORDER — CIPROFLOXACIN HCL 500 MG PO TABS
500.0000 mg | ORAL_TABLET | ORAL | Status: AC
  Administered 2016-06-17: 500 mg via ORAL
  Filled 2016-06-17: qty 1

## 2016-06-17 MED ORDER — ACETAMINOPHEN 650 MG RE SUPP
650.0000 mg | RECTAL | Status: DC | PRN
  Filled 2016-06-17: qty 1

## 2016-06-17 MED ORDER — SODIUM CHLORIDE 0.9 % IV SOLN: 250.0000 mL | INTRAVENOUS | Status: DC | PRN

## 2016-06-17 MED ORDER — SODIUM CHLORIDE 0.9% FLUSH: 3.0000 mL | Freq: Two times a day (BID) | INTRAVENOUS | Status: DC

## 2016-06-17 MED ORDER — DIPHENHYDRAMINE HCL 25 MG PO CAPS
25.0000 mg | ORAL_CAPSULE | ORAL | Status: AC
  Administered 2016-06-17: 25 mg via ORAL
  Filled 2016-06-17: qty 1

## 2016-06-17 MED ORDER — SODIUM CHLORIDE 0.9% FLUSH: 3.0000 mL | INTRAVENOUS | Status: DC | PRN

## 2016-06-17 NOTE — Discharge Instructions (Signed)
Lithotripsy, Care After °This sheet gives you information about how to care for yourself after your procedure. Your health care provider may also give you more specific instructions. If you have problems or questions, contact your health care provider. °What can I expect after the procedure? °After the procedure, it is common to have: °· Some blood in your urine. This should only last for a few days. °· Soreness in your back, sides, or upper abdomen for a few days. °· Blotches or bruises on your back where the pressure wave entered the skin. °· Pain, discomfort, or nausea when pieces (fragments) of the kidney stone move through the tube that carries urine from the kidney to the bladder (ureter). Stone fragments may pass soon after the procedure, but they may continue to pass for up to 4-8 weeks. °¨ If you have severe pain or nausea, contact your health care provider. This may be caused by a large stone that was not broken up, and this may mean that you need more treatment. °· Some pain or discomfort during urination. °· Some pain or discomfort in the lower abdomen or (in men) at the base of the penis. °Follow these instructions at home: °Medicines  °· Take over-the-counter and prescription medicines only as told by your health care provider. °· If you were prescribed an antibiotic medicine, take it as told by your health care provider. Do not stop taking the antibiotic even if you start to feel better. °· Do not drive for 24 hours if you were given a medicine to help you relax (sedative). °· Do not drive or use heavy machinery while taking prescription pain medicine. °Eating and drinking  °· Drink enough water and fluids to keep your urine clear or pale yellow. This helps any remaining pieces of the stone to pass. It can also help prevent new stones from forming. °· Eat plenty of fresh fruits and vegetables. °· Follow instructions from your health care provider about eating and drinking restrictions. You may be  instructed: °¨ To reduce how much salt (sodium) you eat or drink. Check ingredients and nutrition facts on packaged foods and beverages. °¨ To reduce how much meat you eat. °· Eat the recommended amount of calcium for your age and gender. Ask your health care provider how much calcium you should have. °General instructions  °· Get plenty of rest. °· Most people can resume normal activities 1-2 days after the procedure. Ask your health care provider what activities are safe for you. °· If directed, strain all urine through the strainer that was provided by your health care provider. °¨ Keep all fragments for your health care provider to see. Any stones that are found may be sent to a medical lab for examination. The stone may be as small as a grain of salt. °· Keep all follow-up visits as told by your health care provider. This is important. °Contact a health care provider if: °· You have pain that is severe or does not get better with medicine. °· You have nausea that is severe or does not go away. °· You have blood in your urine longer than your health care provider told you to expect. °· You have more blood in your urine. °· You have pain during urination that does not go away. °· You urinate more frequently than usual and this does not go away. °· You develop a rash or any other possible signs of an allergic reaction. °Get help right away if: °· You have severe   pain in your back, sides, or upper abdomen. °· You have severe pain while urinating. °· Your urine is very dark red. °· You have blood in your stool (feces). °· You cannot pass any urine at all. °· You feel a strong urge to urinate after emptying your bladder. °· You have a fever or chills. °· You develop shortness of breath, difficulty breathing, or chest pain. °· You have severe nausea that leads to persistent vomiting. °· You faint. °Summary °· After this procedure, it is common to have some pain, discomfort, or nausea when pieces (fragments) of the  kidney stone move through the tube that carries urine from the kidney to the bladder (ureter). If this pain or nausea is severe, however, you should contact your health care provider. °· Most people can resume normal activities 1-2 days after the procedure. Ask your health care provider what activities are safe for you. °· Drink enough water and fluids to keep your urine clear or pale yellow. This helps any remaining pieces of the stone to pass, and it can help prevent new stones from forming. °· If directed, strain your urine and keep all fragments for your health care provider to see. Fragments or stones may be as small as a grain of salt. °· Get help right away if you have severe pain in your back, sides, or upper abdomen or have severe pain while urinating. °This information is not intended to replace advice given to you by your health care provider. Make sure you discuss any questions you have with your health care provider. °Document Released: 04/14/2007 Document Revised: 02/14/2016 Document Reviewed: 02/14/2016 °Elsevier Interactive Patient Education © 2017 [employer] ° ° °Moderate Conscious Sedation, Adult, Care After °These instructions provide you with information about caring for yourself after your procedure. Your health care provider may also give you more specific instructions. Your treatment has been planned according to current medical practices, but problems sometimes occur. Call your health care provider if you have any problems or questions after your procedure. °What can I expect after the procedure? °After your procedure, it is common: °· To feel sleepy for several hours. °· To feel clumsy and have poor balance for several hours. °· To have poor judgment for several hours. °· To vomit if you eat too soon. °Follow these instructions at home: °For at least 24 hours after the procedure:  ° °· Do not: °¨ Participate in activities where you could fall or become injured. °¨ Drive. °¨ Use heavy  machinery. °¨ Drink alcohol. °¨ Take sleeping pills or medicines that cause drowsiness. °¨ Make important decisions or sign legal documents. °¨ Take care of children on your own. °· Rest. °Eating and drinking  °· Follow the diet recommended by your health care provider. °· If you vomit: °¨ Drink water, juice, or soup when you can drink without vomiting. °¨ Make sure you have little or no nausea before eating solid foods. °General instructions  °· Have a responsible adult stay with you until you are awake and alert. °· Take over-the-counter and prescription medicines only as told by your health care provider. °· If you smoke, do not smoke without supervision. °· Keep all follow-up visits as told by your health care provider. This is important. °Contact a health care provider if: °· You keep feeling nauseous or you keep vomiting. °· You feel light-headed. °· You develop a rash. °· You have a fever. °Get help right away if: °· You have trouble breathing. °This information   is not intended to replace advice given to you by your health care provider. Make sure you discuss any questions you have with your health care provider. °Document Released: 01/13/2013 Document Revised: 08/28/2015 Document Reviewed: 07/15/2015 °Elsevier Interactive Patient Education © 2017 [employer] ° ° °

## 2016-06-17 NOTE — H&P (View-Only) (Signed)
Urology Consult   Physician requesting consult: Rizwan Reason for consult: Kidney stone  History of Present Illness: Abigail Mills is a 51 y.o. female who presented to the emergency room at [facility] in [place] last night following sudden onset of left flank pain, nausea and vomiting at 7 p.m.  She was found to have a 5 by 7 millimeter left ureteral stone.  Apparently, she could not be made comfortable there, and the hospitalist service would not admit her for comfort measures.  There is no urologist in [place] at this time.  Urology here, was contacted and instructed the emergency room staff to transport the patient to [facility] for further evaluation and management of the stone.  The patient, until just prior to her transport, was fairly comfortable.  She did get a small dose of fentanyl prior to the transportation.  Currently, she is fairly comfortable.  She has had no fever, gross hematuria, or long-standing left flank pain.  This is her first kidney stone.    Past Medical History:  Diagnosis Date  . Depression   . Seasonal allergies     Past Surgical History:  Procedure Laterality Date  . CESAREAN SECTION    . Left hand surgery       Current Hospital Medications: Scheduled Meds: . buPROPion  150 mg Oral Daily  . [START ON 06/14/2016] estradiol  0.05 mg Transdermal Once  . loratadine  10 mg Oral Daily  . progesterone  100 mg Oral QHS  . tamsulosin  0.4 mg Oral Daily   Continuous Infusions: . sodium chloride     PRN Meds:.acetaminophen **OR** acetaminophen, morphine injection, ondansetron **OR** ondansetron (ZOFRAN) IV, oxyCODONE  Allergies:  Allergies  Allergen Reactions  . No Known Allergies     History reviewed. No pertinent family history.  Social History:  reports that she has never smoked. She has never used smokeless tobacco. She reports that she does not drink alcohol or use drugs.  ROS: A complete review of systems was  performed.  All systems are negative except for pertinent findings as noted.  Physical Exam:  Vital signs in last 24 hours: Temp:  [98.4 F (36.9 C)] 98.4 F (36.9 C) (03/07 1607) Pulse Rate:  [68] 68 (03/07 1607) Resp:  [20] 20 (03/07 1607) BP: (142)/(62) 142/62 (03/07 1607) SpO2:  [99 %] 99 % (03/07 1607) Weight:  [65.8 kg (145 lb)] 65.8 kg (145 lb) (03/07 1607) General:  Alert and oriented, No acute distress HEENT: Normocephalic, atraumatic Neck: No JVD or lymphadenopathy Cardiovascular: Regular rate and rhythm Lungs: Clear bilaterally Abdomen: Soft, nontender, nondistended, no abdominal masses Extremities: No edema Neurologic: Grossly intact  Laboratory Data:  No results for input(s): WBC, HGB, HCT, PLT in the last 72 hours.  No results for input(s): NA, K, CL, GLUCOSE, BUN, CALCIUM, CREATININE in the last 72 hours.  Invalid input(s): CO3   No results found for this or any previous visit (from the past 24 hour(s)). No results found for this or any previous visit (from the past 240 hour(s)).  Renal Function: No results for input(s): CREATININE in the last 168 hours. CrCl cannot be calculated (No order found.).  Radiologic Imaging: I reviewed CT images with the patient.  She does have a 5 by 7 millimeter left upper ureteral stone just at the level of the lower renal margin.  No other renal calculi are noted.  I independently reviewed the above imaging studies.  Impression/Assessment:  4.3 by 8 millimeter left ureteral  stone.  This is well seen on the scout film.  Skin the stone distance approximately 12 centimeters, Hounsfield units 658.  Plan:  I agree with admission for pain management.  I discussed further management with the patient-medical expulsive therapy with pain management as an outpatient, shockwave lithotripsy or anesthetic ureteroscopy with holmium laser lithotripsy and extraction of her stone.  The patient has not been strictly nothing by mouth, so at  this point, I don't think urgent anesthetic procedure is necessary.  Unfortunately, she has had Toradol in the emergency room in [place]-even though it was only 10 milligrams intravenous, this would preclude shockwave lithotripsy due to platelet dysfunction.  I have spoken with the patient.  Currently, she is fairly comfortable.  She is in agreement that if she is comfortable overnight, morning discharge would be a possibility with going home on by mouth hydromorphone, nausea medicine, as well as Flomax.  We will see about getting her scheduled for lithotripsy for Monday the 12th.  45 minutes spent face-to-face with the patient.

## 2016-06-17 NOTE — Interval H&P Note (Signed)
History and Physical Interval Note:  Her stone has moved to the LUVJ  06/17/2016 1:13 PM  Abigail Mills  has presented today for surgery, with the diagnosis of LEFT URETERAL STONE  The various methods of treatment have been discussed with the patient and family. After consideration of risks, benefits and other options for treatment, the patient has consented to  Procedure(s): LEFT EXTRACORPOREAL SHOCK WAVE LITHOTRIPSY (ESWL) (Left) as a surgical intervention .  The patient's history has been reviewed, patient examined, no change in status, stable for surgery.  I have reviewed the patient's chart and labs.  Questions were answered to the patient's satisfaction.     Russell Quinney J

## 2016-06-25 NOTE — ED Provider Notes (Signed)
[facility]-EMERGENCY DEPT Provider Note   CSN: [id number] Arrival date & time: 06/14/16  1632     History   Chief Complaint Chief Complaint  Patient presents with  . Abdominal Pain    HPI Abigail Mills is a 51 y.o. female.  HPI   50yF with L abdominal pain. Recently discharged with kidney stone. And having lower abd pain with n/v patient took Dulcolax last night and today and just had "massive one" in lobby bathroom.  Scheduled for lithotripsy. Told not to take nsaids because of this. Says pain/nausea poorly controlled at home.   Past Medical History:  Diagnosis Date  . Depression   . Seasonal allergies     Patient Active Problem List   Diagnosis Date Noted  . Left nephrolithiasis 06/12/2016  . Hydronephrosis of left kidney 06/12/2016  . Depression with anxiety 06/12/2016  . Hematuria 06/12/2016  . Nephrolithiasis 06/12/2016    Past Surgical History:  Procedure Laterality Date  . CESAREAN SECTION    . Left hand surgery      OB History    No data available       Home Medications    Prior to Admission medications   Medication Sig Start Date End Date Taking? Authorizing Provider  bisacodyl (DULCOLAX) 5 MG EC tablet Take 5 mg by mouth daily as needed for moderate constipation.   Yes Historical Provider, MD  buPROPion (WELLBUTRIN XL) 150 MG 24 hr tablet Take 150 mg by mouth daily. 03/25/16  Yes Historical Provider, MD  fexofenadine (ALLEGRA) 30 MG tablet Take 30 mg by mouth daily.    Yes Historical Provider, MD  Probiotic Product (PROBIOTIC PO) Take 1 capsule by mouth daily.   Yes Historical Provider, MD  progesterone (PROMETRIUM) 100 MG capsule TAKE 1 C BY MOUTH AT BEDTIME 05/26/16  Yes Historical Provider, MD  tamsulosin (FLOMAX) 0.4 MG CAPS capsule Take 1 capsule (0.4 mg total) by mouth daily after supper. 06/13/16  Yes Albertine GratesFang Xu, MD  estradiol (VIVELLE-DOT) 0.05 MG/24HR patch APP 1 PA EXT TO THE SKIN 2 TIMES A WK 05/22/16   Historical Provider, MD    HYDROmorphone (DILAUDID) 2 MG tablet Take 1 tablet (2 mg total) by mouth every 4 (four) hours as needed for severe pain. 06/17/16   Bjorn PippinJohn Wrenn, MD  promethazine (PHENERGAN) 25 MG tablet Take 1 tablet (25 mg total) by mouth every 8 (eight) hours as needed for nausea or vomiting. 06/14/16   Raeford RazorStephen Quandre Polinski, MD    Family History No family history on file.  Social History Social History  Substance Use Topics  . Smoking status: Never Smoker  . Smokeless tobacco: Never Used  . Alcohol use No     Comment: occasional beer and wine     Allergies   No known allergies   Review of Systems Review of Systems  All systems reviewed and negative, other than as noted in HPI.   Physical Exam Updated Vital Signs BP 121/82   Pulse 82   Temp 98.7 F (37.1 C) (Oral)   Resp 16   SpO2 96%   Physical Exam  Constitutional: She appears well-developed and well-nourished. No distress.  HENT:  Head: Normocephalic and atraumatic.  Eyes: Conjunctivae are normal. Right eye exhibits no discharge. Left eye exhibits no discharge.  Neck: Neck supple.  Cardiovascular: Normal rate, regular rhythm and normal heart sounds.  Exam reveals no gallop and no friction rub.   No murmur heard. Pulmonary/Chest: Effort normal and breath sounds normal. No respiratory distress.  Abdominal: Soft. She exhibits no distension. There is tenderness. There is no rebound and no guarding.  l abdominal and suprapubic tenderness  Musculoskeletal: She exhibits no edema or tenderness.  Neurological: She is alert.  Skin: Skin is warm and dry.  Psychiatric: She has a normal mood and affect. Her behavior is normal. Thought content normal.  Nursing note and vitals reviewed.    ED Treatments / Results  Labs (all labs ordered are listed, but only abnormal results are displayed) Labs Reviewed  COMPREHENSIVE METABOLIC PANEL - Abnormal; Notable for the following:       Result Value   Potassium 3.3 (*)    Glucose, Bld 128 (*)     Creatinine, Ser 1.28 (*)    Calcium 8.5 (*)    GFR calc non Af Amer 47 (*)    GFR calc Af Amer 55 (*)    All other components within normal limits  CBC - Abnormal; Notable for the following:    WBC 12.0 (*)    All other components within normal limits  URINALYSIS, ROUTINE W REFLEX MICROSCOPIC - Abnormal; Notable for the following:    Color, Urine AMBER (*)    APPearance HAZY (*)    Hgb urine dipstick MODERATE (*)    Ketones, ur 80 (*)    Leukocytes, UA TRACE (*)    Bacteria, UA RARE (*)    Squamous Epithelial / LPF 0-5 (*)    All other components within normal limits  LIPASE, BLOOD    EKG  EKG Interpretation None       Radiology No results found.  Procedures Procedures (including critical care time)  Medications Ordered in ED Medications  sodium chloride 0.9 % bolus 1,000 mL (0 mLs Intravenous Stopped 06/14/16 2004)  HYDROmorphone (DILAUDID) injection 1 mg (1 mg Intravenous Given 06/14/16 1836)  promethazine (PHENERGAN) injection 12.5 mg (12.5 mg Intravenous Given 06/14/16 1835)  promethazine (PHENERGAN) injection 12.5 mg (12.5 mg Intravenous Given 06/14/16 2004)     Initial Impression / Assessment and Plan / ED Course  I have reviewed the triage vital signs and the nursing notes.  Pertinent labs & imaging results that were available during my care of the patient were reviewed by me and considered in my medical decision making (see chart for details).      Final Clinical Impressions(s) / ED Diagnoses   Final diagnoses:  Left sided abdominal pain    New Prescriptions Discharge Medication List as of 06/14/2016  7:48 PM    START taking these medications   Details  promethazine (PHENERGAN) 25 MG tablet Take 1 tablet (25 mg total) by mouth every 8 (eight) hours as needed for nausea or vomiting., Starting Fri 06/14/2016, Print         Raeford Razor, MD 06/25/16 1420

## 2016-06-27 ENCOUNTER — Encounter (HOSPITAL_COMMUNITY): Payer: Self-pay | Admitting: Urology

## 2016-07-16 ENCOUNTER — Other Ambulatory Visit: Payer: Self-pay | Admitting: Urology

## 2016-07-16 ENCOUNTER — Ambulatory Visit (HOSPITAL_COMMUNITY)
Admission: RE | Admit: 2016-07-16 | Discharge: 2016-07-16 | Disposition: A | Discharge status: Home / Self Care | Payer: BLUE CROSS/BLUE SHIELD | Source: Ambulatory Visit | Attending: Urology | Admitting: Urology

## 2016-07-16 ENCOUNTER — Ambulatory Visit (INDEPENDENT_AMBULATORY_CARE_PROVIDER_SITE_OTHER): Payer: Self-pay | Admitting: Urology

## 2016-07-16 DIAGNOSIS — N201 Calculus of ureter: Secondary | ICD-10-CM | POA: Diagnosis not present

## 2016-09-14 DIAGNOSIS — L237 Allergic contact dermatitis due to plants, except food: Secondary | ICD-10-CM | POA: Diagnosis not present

## 2016-11-12 DIAGNOSIS — Z8041 Family history of malignant neoplasm of ovary: Secondary | ICD-10-CM | POA: Diagnosis not present

## 2016-11-12 DIAGNOSIS — Z23 Encounter for immunization: Secondary | ICD-10-CM | POA: Diagnosis not present

## 2016-11-12 DIAGNOSIS — Z1231 Encounter for screening mammogram for malignant neoplasm of breast: Secondary | ICD-10-CM | POA: Diagnosis not present

## 2016-11-12 DIAGNOSIS — Z6824 Body mass index (BMI) 24.0-24.9, adult: Secondary | ICD-10-CM | POA: Diagnosis not present

## 2016-11-12 DIAGNOSIS — Z01419 Encounter for gynecological examination (general) (routine) without abnormal findings: Secondary | ICD-10-CM | POA: Diagnosis not present

## 2017-03-03 DIAGNOSIS — F419 Anxiety disorder, unspecified: Secondary | ICD-10-CM | POA: Diagnosis not present

## 2017-03-19 DIAGNOSIS — F419 Anxiety disorder, unspecified: Secondary | ICD-10-CM | POA: Diagnosis not present

## 2017-03-20 DIAGNOSIS — F419 Anxiety disorder, unspecified: Secondary | ICD-10-CM | POA: Diagnosis not present

## 2017-12-23 DIAGNOSIS — S62639A Displaced fracture of distal phalanx of unspecified finger, initial encounter for closed fracture: Secondary | ICD-10-CM | POA: Diagnosis not present

## 2018-01-29 DIAGNOSIS — Z6825 Body mass index (BMI) 25.0-25.9, adult: Secondary | ICD-10-CM | POA: Diagnosis not present

## 2018-01-29 DIAGNOSIS — Z1231 Encounter for screening mammogram for malignant neoplasm of breast: Secondary | ICD-10-CM | POA: Diagnosis not present

## 2018-01-29 DIAGNOSIS — Z01419 Encounter for gynecological examination (general) (routine) without abnormal findings: Secondary | ICD-10-CM | POA: Diagnosis not present

## 2018-01-29 DIAGNOSIS — Z8041 Family history of malignant neoplasm of ovary: Secondary | ICD-10-CM | POA: Diagnosis not present

## 2018-01-29 DIAGNOSIS — Z13220 Encounter for screening for lipoid disorders: Secondary | ICD-10-CM | POA: Diagnosis not present

## 2018-02-09 DIAGNOSIS — Z8041 Family history of malignant neoplasm of ovary: Secondary | ICD-10-CM | POA: Diagnosis not present

## 2018-05-01 DIAGNOSIS — R6889 Other general symptoms and signs: Secondary | ICD-10-CM | POA: Diagnosis not present

## 2018-10-16 IMAGING — CR DG ABDOMEN 1V
2 series · 2 of 2 positions shown · non-contrast
Comparison: No recent prior.

CLINICAL DATA: Lithotripsy.

EXAM:
ABDOMEN - 1 VIEW

[t abdomen supine *]
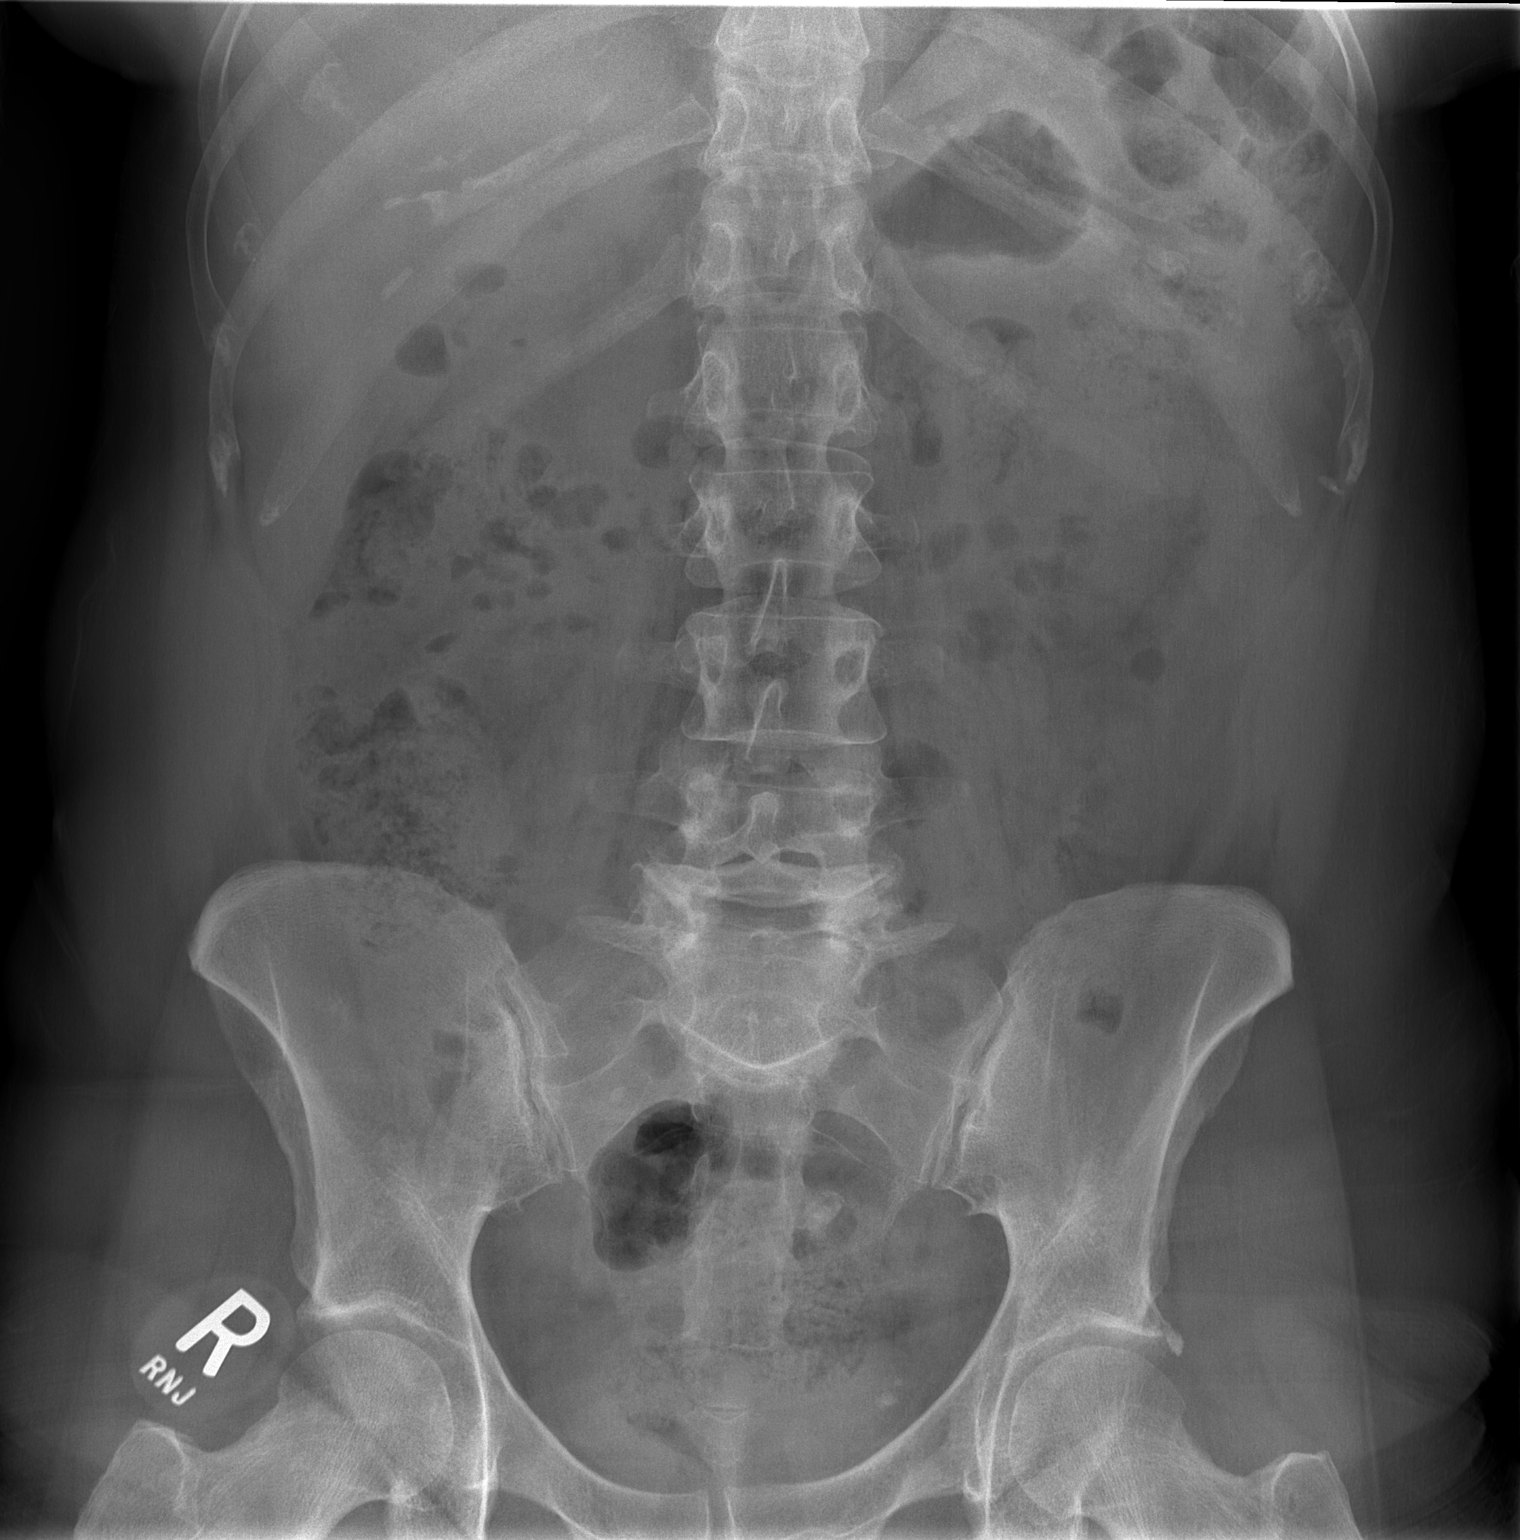

[t abdomen supine]
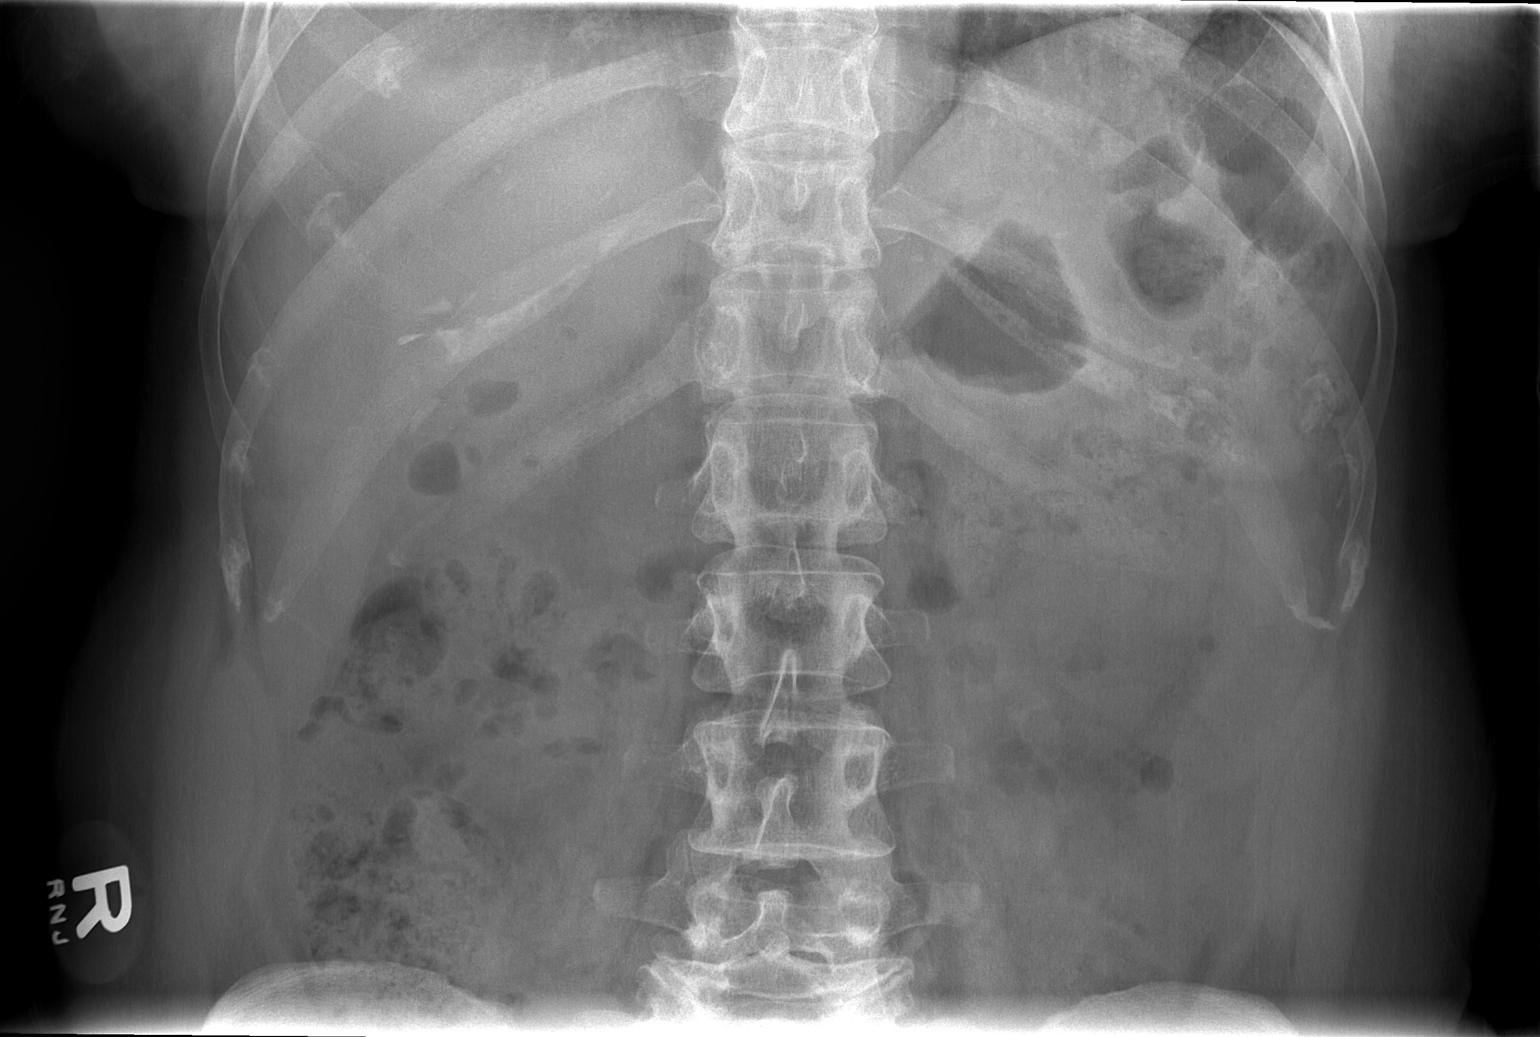

[2 of 2 positions shown; findings below may reference images not displayed]

FINDINGS: 6 mm calcific densities noted projected over the right portion of
the sacrum. This could represent a distal right ureteral stone.
Small punctate calcifications are also noted over the left lower
pelvis. These may represent phleboliths. Distal left ureteral stones
cannot be excluded. No bowel distention. Stool noted throughout the
colon. No acute bony abnormality.
IMPRESSION: 1. 6 mm calcific density projected over the right portion of the
sacrum. This could represent a distal right ureteral stone. Tiny
punctate calcifications noted over the left lower pelvis. These may
represent phleboliths. Distal left ureteral stones cannot be
excluded.

## 2018-12-09 ENCOUNTER — Other Ambulatory Visit: Payer: Self-pay

## 2018-12-09 DIAGNOSIS — Z20822 Contact with and (suspected) exposure to covid-19: Secondary | ICD-10-CM

## 2018-12-10 LAB — NOVEL CORONAVIRUS, NAA: SARS-CoV-2, NAA: NOT DETECTED

## 2019-02-05 ENCOUNTER — Other Ambulatory Visit: Payer: Self-pay

## 2019-02-05 DIAGNOSIS — Z20822 Contact with and (suspected) exposure to covid-19: Secondary | ICD-10-CM

## 2019-02-07 LAB — NOVEL CORONAVIRUS, NAA: SARS-CoV-2, NAA: NOT DETECTED

## 2019-02-12 DIAGNOSIS — F413 Other mixed anxiety disorders: Secondary | ICD-10-CM | POA: Diagnosis not present

## 2019-02-27 DIAGNOSIS — Z20828 Contact with and (suspected) exposure to other viral communicable diseases: Secondary | ICD-10-CM | POA: Diagnosis not present

## 2019-03-11 DIAGNOSIS — Z1231 Encounter for screening mammogram for malignant neoplasm of breast: Secondary | ICD-10-CM | POA: Diagnosis not present

## 2019-03-11 DIAGNOSIS — Z6825 Body mass index (BMI) 25.0-25.9, adult: Secondary | ICD-10-CM | POA: Diagnosis not present

## 2019-03-11 DIAGNOSIS — Z1151 Encounter for screening for human papillomavirus (HPV): Secondary | ICD-10-CM | POA: Diagnosis not present

## 2019-03-11 DIAGNOSIS — Z01419 Encounter for gynecological examination (general) (routine) without abnormal findings: Secondary | ICD-10-CM | POA: Diagnosis not present

## 2019-03-19 DIAGNOSIS — Z8041 Family history of malignant neoplasm of ovary: Secondary | ICD-10-CM | POA: Diagnosis not present

## 2019-04-15 ENCOUNTER — Ambulatory Visit: Payer: Self-pay | Attending: Internal Medicine

## 2019-04-15 ENCOUNTER — Other Ambulatory Visit: Payer: Self-pay

## 2019-04-15 DIAGNOSIS — Z20822 Contact with and (suspected) exposure to covid-19: Secondary | ICD-10-CM | POA: Insufficient documentation

## 2019-04-17 LAB — NOVEL CORONAVIRUS, NAA: SARS-CoV-2, NAA: NOT DETECTED

## 2019-06-02 DIAGNOSIS — F4310 Post-traumatic stress disorder, unspecified: Secondary | ICD-10-CM | POA: Diagnosis not present

## 2019-06-09 DIAGNOSIS — F4310 Post-traumatic stress disorder, unspecified: Secondary | ICD-10-CM | POA: Diagnosis not present

## 2019-06-24 DIAGNOSIS — F4310 Post-traumatic stress disorder, unspecified: Secondary | ICD-10-CM | POA: Diagnosis not present

## 2019-07-08 DIAGNOSIS — Z23 Encounter for immunization: Secondary | ICD-10-CM | POA: Diagnosis not present

## 2019-07-08 DIAGNOSIS — F4310 Post-traumatic stress disorder, unspecified: Secondary | ICD-10-CM | POA: Diagnosis not present

## 2019-07-22 DIAGNOSIS — F4310 Post-traumatic stress disorder, unspecified: Secondary | ICD-10-CM | POA: Diagnosis not present

## 2019-07-30 DIAGNOSIS — Z23 Encounter for immunization: Secondary | ICD-10-CM | POA: Diagnosis not present

## 2019-08-04 DIAGNOSIS — F4310 Post-traumatic stress disorder, unspecified: Secondary | ICD-10-CM | POA: Diagnosis not present

## 2019-10-13 DIAGNOSIS — F413 Other mixed anxiety disorders: Secondary | ICD-10-CM | POA: Diagnosis not present

## 2019-10-20 DIAGNOSIS — F4310 Post-traumatic stress disorder, unspecified: Secondary | ICD-10-CM | POA: Diagnosis not present

## 2020-01-13 DIAGNOSIS — F4310 Post-traumatic stress disorder, unspecified: Secondary | ICD-10-CM | POA: Diagnosis not present

## 2020-01-26 DIAGNOSIS — F4310 Post-traumatic stress disorder, unspecified: Secondary | ICD-10-CM | POA: Diagnosis not present

## 2020-03-10 DIAGNOSIS — Z1231 Encounter for screening mammogram for malignant neoplasm of breast: Secondary | ICD-10-CM | POA: Diagnosis not present

## 2020-03-10 DIAGNOSIS — Z8041 Family history of malignant neoplasm of ovary: Secondary | ICD-10-CM | POA: Diagnosis not present

## 2020-03-10 DIAGNOSIS — Z01419 Encounter for gynecological examination (general) (routine) without abnormal findings: Secondary | ICD-10-CM | POA: Diagnosis not present

## 2020-03-10 DIAGNOSIS — Z6824 Body mass index (BMI) 24.0-24.9, adult: Secondary | ICD-10-CM | POA: Diagnosis not present

## 2020-03-16 ENCOUNTER — Other Ambulatory Visit: Payer: Self-pay

## 2020-03-16 DIAGNOSIS — Z20822 Contact with and (suspected) exposure to covid-19: Secondary | ICD-10-CM

## 2020-03-17 LAB — NOVEL CORONAVIRUS, NAA: SARS-CoV-2, NAA: NOT DETECTED

## 2020-03-17 LAB — SARS-COV-2, NAA 2 DAY TAT

## 2020-03-30 DIAGNOSIS — Z842 Family history of other diseases of the genitourinary system: Secondary | ICD-10-CM | POA: Diagnosis not present

## 2020-04-05 ENCOUNTER — Other Ambulatory Visit: Payer: Self-pay

## 2020-04-05 DIAGNOSIS — Z20822 Contact with and (suspected) exposure to covid-19: Secondary | ICD-10-CM

## 2020-04-06 ENCOUNTER — Telehealth: Payer: Self-pay | Admitting: *Deleted

## 2020-04-06 LAB — SARS-COV-2, NAA 2 DAY TAT

## 2020-04-06 LAB — NOVEL CORONAVIRUS, NAA: SARS-CoV-2, NAA: NOT DETECTED

## 2020-04-06 NOTE — Telephone Encounter (Signed)
Calling for Covid results. Pending at this time. Check MyChart when message appears.

## 2020-11-19 ENCOUNTER — Other Ambulatory Visit: Payer: Self-pay

## 2020-11-19 ENCOUNTER — Emergency Department (HOSPITAL_COMMUNITY)
Admission: EM | Admit: 2020-11-19 | Discharge: 2020-11-19 | Disposition: A | Discharge status: Home / Self Care | Payer: BC Managed Care – PPO | Attending: Emergency Medicine | Admitting: Emergency Medicine

## 2020-11-19 DIAGNOSIS — Y92007 Garden or yard of unspecified non-institutional (private) residence as the place of occurrence of the external cause: Secondary | ICD-10-CM | POA: Diagnosis not present

## 2020-11-19 DIAGNOSIS — S0501XA Injury of conjunctiva and corneal abrasion without foreign body, right eye, initial encounter: Secondary | ICD-10-CM | POA: Insufficient documentation

## 2020-11-19 DIAGNOSIS — S0591XA Unspecified injury of right eye and orbit, initial encounter: Secondary | ICD-10-CM | POA: Diagnosis present

## 2020-11-19 DIAGNOSIS — Y990 Civilian activity done for income or pay: Secondary | ICD-10-CM | POA: Insufficient documentation

## 2020-11-19 DIAGNOSIS — X58XXXA Exposure to other specified factors, initial encounter: Secondary | ICD-10-CM | POA: Insufficient documentation

## 2020-11-19 MED ORDER — FLUORESCEIN SODIUM 1 MG OP STRP
1.0000 | ORAL_STRIP | Freq: Once | OPHTHALMIC | Status: AC
  Administered 2020-11-19: 1 via OPHTHALMIC
  Filled 2020-11-19: qty 1

## 2020-11-19 MED ORDER — TETRACAINE HCL 0.5 % OP SOLN
1.0000 [drp] | Freq: Once | OPHTHALMIC | Status: AC
  Administered 2020-11-19: 1 [drp] via OPHTHALMIC
  Filled 2020-11-19: qty 4

## 2020-11-19 MED ORDER — TETRACAINE HCL 0.5 % OP SOLN: 2.0000 [drp] | Freq: Once | OPHTHALMIC | Status: DC

## 2020-11-19 MED ORDER — TOBRAMYCIN 0.3 % OP SOLN
2.0000 [drp] | Freq: Once | OPHTHALMIC | Status: AC
  Administered 2020-11-19: 2 [drp] via OPHTHALMIC

## 2020-11-19 MED ORDER — OFLOXACIN 0.3 % OP SOLN
1.0000 [drp] | Freq: Four times a day (QID) | OPHTHALMIC | Status: DC
  Filled 2020-11-19: qty 5

## 2020-11-19 MED ORDER — FLUORESCEIN SODIUM 1 MG OP STRP: 1.0000 | ORAL_STRIP | Freq: Once | OPHTHALMIC | Status: DC

## 2020-11-19 NOTE — ED Provider Notes (Signed)
Medical screening examination/treatment/procedure(s) were conducted as a shared visit with non-physician practitioner(s) and myself.  I personally evaluated the patient during the encounter.  Clinical Impression:   Final diagnoses:  Abrasion of right cornea, initial encounter    Was doing yard work today Got out of shower - had some stuff in her hair Used "dawn soap" to get it out Had FB sensation in her eye - now with pain and redness Tried to flush - with water - went to store - more flush Still has redness / tearing / visual blurriness. Sensitive to light and uncomfortable.  Woods lamp and fluoroscein. Corneal abrasion - abx given - tobramycin and optho fu   Eber Hong, MD 11/20/20 1541

## 2020-11-19 NOTE — ED Notes (Addendum)
Pt states the eye pain is getting worse and wants to know when EDP will be in. Reassured pt and provided pt with update on provider status.

## 2020-11-19 NOTE — ED Provider Notes (Addendum)
[facility] EMERGENCY DEPARTMENT Provider Note   CSN: [id number] Arrival date & time: 11/19/20  1740     History Chief Complaint  Patient presents with   [facility] Wynona Neat is a 55 y.o. female with a complaint of right eye pain she states that she was doing some yard work earlier today and that when she got in the shower she felt as though she had some dirt in her hair.  She read that she should wash her hair with Dawn soap.  After this wash she began feeling as though she had a foreign body in her right eye.  That progressed to redness, pain, tearing.  Endorses blurring vision.  Attempted to flush out her eye with normal water and over-the-counter contact solution.  This did not help.  Is having difficulty opening her eye.  Does not wear contact lenses.  No problems with the left eye.     Past Medical History:  Diagnosis Date   Depression    Seasonal allergies     Patient Active Problem List   Diagnosis Date Noted   Left nephrolithiasis 06/12/2016   Hydronephrosis of left kidney 06/12/2016   Depression with anxiety 06/12/2016   Hematuria 06/12/2016   Nephrolithiasis 06/12/2016    Past Surgical History:  Procedure Laterality Date   CESAREAN SECTION     EXTRACORPOREAL SHOCK WAVE LITHOTRIPSY Left 06/17/2016   Procedure: LEFT EXTRACORPOREAL SHOCK WAVE LITHOTRIPSY (ESWL);  Surgeon: Bjorn Pippin, MD;  Location: [facility] ORS;  Service: Urology;  Laterality: Left;   Left hand surgery       OB History   No obstetric history on file.     No family history on file.  Social History   Tobacco Use   Smoking status: Never   Smokeless tobacco: Never  Substance Use Topics   Alcohol use: No    Comment: occasional beer and wine   Drug use: No    Home Medications Prior to Admission medications   Medication Sig Start Date End Date Taking? Authorizing Provider  bisacodyl (DULCOLAX) 5 MG EC tablet Take 5 mg by mouth daily as needed for moderate  constipation.    [provider]  buPROPion (WELLBUTRIN XL) 150 MG 24 hr tablet Take 150 mg by mouth daily. 03/25/16   [provider]  estradiol (VIVELLE-DOT) 0.05 MG/24HR patch APP 1 PA EXT TO THE SKIN 2 TIMES A WK 05/22/16   [provider]  fexofenadine (ALLEGRA) 30 MG tablet Take 30 mg by mouth daily.     [provider]  HYDROmorphone (DILAUDID) 2 MG tablet Take 1 tablet (2 mg total) by mouth every 4 (four) hours as needed for severe pain. 06/17/16   Bjorn Pippin, MD  Probiotic Product (PROBIOTIC PO) Take 1 capsule by mouth daily.    [provider]  progesterone (PROMETRIUM) 100 MG capsule TAKE 1 C BY MOUTH AT BEDTIME 05/26/16   [provider]  promethazine (PHENERGAN) 25 MG tablet Take 1 tablet (25 mg total) by mouth every 8 (eight) hours as needed for nausea or vomiting. 06/14/16   Raeford Razor, MD  tamsulosin (FLOMAX) 0.4 MG CAPS capsule Take 1 capsule (0.4 mg total) by mouth daily after supper. 06/13/16   Albertine Grates, MD    Allergies    No known allergies  Review of Systems   Review of Systems  Constitutional:  Positive for activity change.  HENT:  Negative for congestion, rhinorrhea, sinus pressure, sinus pain and  sneezing.   Eyes:  Positive for photophobia, pain, redness and visual disturbance. Negative for discharge and itching.  Respiratory:  Negative for shortness of breath and wheezing.   Cardiovascular:  Negative for chest pain and palpitations.  Gastrointestinal:  Negative for nausea and vomiting.  Skin:  Negative for rash and wound.  Neurological:  Negative for dizziness, syncope, weakness, light-headedness and headaches.  All other systems reviewed and are negative.  Physical Exam Updated Vital Signs BP (!) 152/102 (BP Location: Right Arm)   Pulse 94   Temp 98 F (36.7 C) (Oral)   Resp 18   Ht 5\' 4"  (1.626 m)   Wt 66.3 kg   SpO2 100%   BMI 25.08 kg/m   Physical Exam Vitals and nursing note reviewed.   Constitutional:      Appearance: Normal appearance. She is normal weight.  HENT:     Head: Normocephalic and atraumatic.  Eyes:     General: No scleral icterus.    Extraocular Movements: Extraocular movements intact.     Pupils: Pupils are equal, round, and reactive to light.     Comments: Right eye positive for redness and mild tearing.  Unable to fully open the eye due to pain.  No consensual pain with pupillary reflex. Reevaluated after tetracaine.  No foreign body noted to the eye.  Large corneal abrasion to the superior to medial aspect of the right eye.  No ulceration noted.  Cardiovascular:     Rate and Rhythm: Normal rate and regular rhythm.  Pulmonary:     Effort: Pulmonary effort is normal. No respiratory distress.  Musculoskeletal:     Cervical back: Normal range of motion.  Skin:    Findings: No bruising, lesion or rash.  Neurological:     Mental Status: She is alert.     Cranial Nerves: No cranial nerve deficit.  Psychiatric:        Mood and Affect: Mood normal.    ED Results / Procedures / Treatments   Labs (all labs ordered are listed, but only abnormal results are displayed) Labs Reviewed - No data to display  EKG None  Radiology No results found.  Procedures Procedures   Medications Ordered in ED Medications  fluorescein ophthalmic strip 1 strip (1 strip Both Eyes Given by Other 11/19/20 1856)  tetracaine (PONTOCAINE) 0.5 % ophthalmic solution 1 drop (1 drop Both Eyes Given by Other 11/19/20 1856)  tobramycin (TOBREX) 0.3 % ophthalmic solution 2 drop (2 drops Right Eye Given 11/19/20 2123)    ED Course  I have reviewed the triage vital signs and the nursing notes.  Pertinent labs & imaging results that were available during my care of the patient were reviewed by me and considered in my medical decision making (see chart for details).  Patient was evaluated by me and appeared mildly uncomfortable with splinting of her right eye.  I was able to  evaluate her eye and she has intact extraocular movements and PERRLA.   Plan to utilize 2124 and fluorescein stain.  Woods lamp assessment revealed large corneal abrasion.  Planned to discharge patient on ofloxacin eyedrops, but pharmacy reports they are not in stock.  MD [employer] notified and antibiotic changed to tobramycin.    MDM Rules/Calculators/A&P                         Patient presented with eye pain, tearing, visual blurring after yard work this morning.  After painful examination I  utilized the [employer] with MD Hyacinth Meeker to better visualize her eye.  This examination revealed a large corneal abrasion covering the superior, medial aspect of the cornea.  Patient reported immediate relief with tetracaine.  Due to the risk of infection patient given ofloxacin drops.  Will be discharged with both of these medications.  Both I and MD Hyacinth Meeker discussed the risks of overuse of tetracaine drops.  Patient reports understanding and will not use drops more than 3 times per day.  Patient agreeable to follow-up in 2 days.  Daughter at bedside and reports understanding.  Medication instructions attached to discharge papers.  Information about corneal abrasions also attached to discharge papers.   Final Clinical Impression(s) / ED Diagnoses Final diagnoses:  Abrasion of right cornea, initial encounter    Rx / DC Orders Results and diagnoses were explained to the patient. Return precautions discussed in full. Patient and daughter had no additional questions and expressed complete understanding.    Saddie Benders, PA-C 11/19/20 2109    Woodroe Chen 11/19/20 2137    Eber Hong, MD 11/20/20 1540

## 2020-11-19 NOTE — Discharge Instructions (Addendum)
Do not use tetracaine drops more than 3 times daily. If used more there is a risk of permanent eye damage.   Antibiotic drops should be used 4 times a day for 5 days. 1-2 drops each time.  Follow-up with the eye doctor next week.

## 2020-11-19 NOTE — ED Notes (Signed)
Pt provided discharge instructions and prescription information. Pt was given the opportunity to ask questions and questions were answered. Discharge signature not obtained in the setting of the COVID-19 pandemic in order to reduce high touch surfaces.  ° °

## 2020-11-19 NOTE — ED Notes (Signed)
D/c delayed. Awaiting eye drops to arrive from pharmacy.

## 2020-11-19 NOTE — ED Triage Notes (Signed)
Pt. States something is in their right eye. Pt. States their eye became irritated after getting out of the shower. Pt had been doing work out in the yard today. Pts. Right Eye is red,watery and swollen. This nurse can not visualize anything foreign in the eye.

## 2020-11-19 NOTE — ED Notes (Signed)
RN supervisor advised that ofloaxacin was unavailable and is currently seeking an alternative.

## 2020-11-19 NOTE — ED Notes (Signed)
Pt coming into hallway and upset she has not been discharge yet. Advised pt that we were awaiting abx eye drops to come from pharmacy.
